# Patient Record
Sex: Male | Born: 1980 | Race: White | Hispanic: No | Marital: Single | Smoking: Current every day smoker
Health system: Southern US, Community
[De-identification: ages and names within clinical notes are randomized; demographics above are authoritative.]

---

## 2015-03-20 ENCOUNTER — Emergency Department (HOSPITAL_COMMUNITY)
Admission: EM | Admit: 2015-03-20 | Discharge: 2015-03-20 | Disposition: A | Discharge status: Home / Self Care | Payer: BLUE CROSS/BLUE SHIELD | Attending: Emergency Medicine | Admitting: Emergency Medicine

## 2015-03-20 ENCOUNTER — Emergency Department (HOSPITAL_COMMUNITY): Payer: BLUE CROSS/BLUE SHIELD

## 2015-03-20 ENCOUNTER — Encounter (HOSPITAL_COMMUNITY): Payer: Self-pay | Admitting: Emergency Medicine

## 2015-03-20 DIAGNOSIS — K529 Noninfective gastroenteritis and colitis, unspecified: Secondary | ICD-10-CM | POA: Insufficient documentation

## 2015-03-20 DIAGNOSIS — Z720 Tobacco use: Secondary | ICD-10-CM | POA: Insufficient documentation

## 2015-03-20 DIAGNOSIS — R1032 Left lower quadrant pain: Secondary | ICD-10-CM | POA: Diagnosis present

## 2015-03-20 LAB — I-STAT CHEM 8, ED
BUN: 4 mg/dL — ABNORMAL LOW (ref 6–20)
CALCIUM ION: 1.1 mmol/L — AB (ref 1.12–1.23)
Chloride: 99 mmol/L — ABNORMAL LOW (ref 101–111)
Creatinine, Ser: 0.9 mg/dL (ref 0.61–1.24)
GLUCOSE: 103 mg/dL — AB (ref 65–99)
HEMATOCRIT: 49 % (ref 39.0–52.0)
Hemoglobin: 16.7 g/dL (ref 13.0–17.0)
POTASSIUM: 3.7 mmol/L (ref 3.5–5.1)
SODIUM: 141 mmol/L (ref 135–145)
TCO2: 28 mmol/L (ref 0–100)

## 2015-03-20 LAB — COMPREHENSIVE METABOLIC PANEL
ALT: 62 U/L (ref 17–63)
AST: 38 U/L (ref 15–41)
Albumin: 4 g/dL (ref 3.5–5.0)
Alkaline Phosphatase: 62 U/L (ref 38–126)
Anion gap: 11 (ref 5–15)
BILIRUBIN TOTAL: 0.7 mg/dL (ref 0.3–1.2)
CO2: 27 mmol/L (ref 22–32)
Calcium: 8.9 mg/dL (ref 8.9–10.3)
Chloride: 101 mmol/L (ref 101–111)
Creatinine, Ser: 1.01 mg/dL (ref 0.61–1.24)
GFR calc Af Amer: >60 mL/min (ref >60)
GFR calc non Af Amer: >60 mL/min (ref >60)
GLUCOSE: 107 mg/dL — AB (ref 65–99)
Potassium: 3.7 mmol/L (ref 3.5–5.1)
Sodium: 139 mmol/L (ref 135–145)
Total Protein: 7.4 g/dL (ref 6.5–8.1)

## 2015-03-20 LAB — CBC WITH DIFFERENTIAL/PLATELET
Basophils Absolute: 0 10*3/uL (ref 0.0–0.1)
Basophils Relative: 0 % (ref 0–1)
Eosinophils Absolute: 0.2 10*3/uL (ref 0.0–0.7)
Eosinophils Relative: 3 % (ref 0–5)
HCT: 46.2 % (ref 39.0–52.0)
HEMOGLOBIN: 16 g/dL (ref 13.0–17.0)
LYMPHS ABS: 2.1 10*3/uL (ref 0.7–4.0)
LYMPHS PCT: 23 % (ref 12–46)
MCH: 29.3 pg (ref 26.0–34.0)
MCHC: 34.6 g/dL (ref 30.0–36.0)
MCV: 84.5 fL (ref 78.0–100.0)
Monocytes Absolute: 0.8 10*3/uL (ref 0.1–1.0)
Monocytes Relative: 9 % (ref 3–12)
NEUTROS PCT: 65 % (ref 43–77)
Neutro Abs: 6 10*3/uL (ref 1.7–7.7)
PLATELETS: 186 10*3/uL (ref 150–400)
RBC: 5.47 MIL/uL (ref 4.22–5.81)
RDW: 13 % (ref 11.5–15.5)
WBC: 9.2 10*3/uL (ref 4.0–10.5)

## 2015-03-20 LAB — I-STAT CG4 LACTIC ACID, ED: Lactic Acid, Venous: 0.94 mmol/L (ref 0.5–2.0)

## 2015-03-20 LAB — C DIFFICILE QUICK SCREEN W PCR REFLEX
C DIFFICILE (CDIFF) INTERP: NEGATIVE
C DIFFICLE (CDIFF) ANTIGEN: NEGATIVE
C Diff toxin: NEGATIVE

## 2015-03-20 LAB — LIPASE, BLOOD: Lipase: 17 U/L — ABNORMAL LOW (ref 22–51)

## 2015-03-20 MED ORDER — METRONIDAZOLE 500 MG PO TABS
500.0000 mg | ORAL_TABLET | Freq: Once | ORAL | Status: AC
  Administered 2015-03-20: 500 mg via ORAL
  Filled 2015-03-20: qty 1

## 2015-03-20 MED ORDER — IOHEXOL 300 MG/ML  SOLN
100.0000 mL | Freq: Once | INTRAMUSCULAR | Status: AC | PRN
  Administered 2015-03-20: 100 mL via INTRAVENOUS

## 2015-03-20 MED ORDER — METRONIDAZOLE 500 MG PO TABS: 500.0000 mg | ORAL_TABLET | Freq: Two times a day (BID) | ORAL | Status: AC

## 2015-03-20 MED ORDER — ONDANSETRON HCL 4 MG/2ML IJ SOLN
4.0000 mg | Freq: Once | INTRAMUSCULAR | Status: AC | PRN
  Administered 2015-03-20: 4 mg via INTRAVENOUS
  Filled 2015-03-20: qty 2

## 2015-03-20 MED ORDER — ONDANSETRON HCL 4 MG PO TABS: 4.0000 mg | ORAL_TABLET | Freq: Three times a day (TID) | ORAL | Status: AC | PRN

## 2015-03-20 MED ORDER — CIPROFLOXACIN HCL 500 MG PO TABS: 500.0000 mg | ORAL_TABLET | Freq: Two times a day (BID) | ORAL | Status: AC

## 2015-03-20 MED ORDER — SODIUM CHLORIDE 0.9 % IV BOLUS (SEPSIS)
1000.0000 mL | Freq: Once | INTRAVENOUS | Status: AC
  Administered 2015-03-20: 1000 mL via INTRAVENOUS

## 2015-03-20 MED ORDER — HYDROCODONE-ACETAMINOPHEN 5-325 MG PO TABS: ORAL_TABLET | ORAL | Status: AC

## 2015-03-20 MED ORDER — DICYCLOMINE HCL 20 MG PO TABS: 20.0000 mg | ORAL_TABLET | Freq: Two times a day (BID) | ORAL | Status: AC

## 2015-03-20 MED ORDER — DICYCLOMINE HCL 10 MG PO CAPS
10.0000 mg | ORAL_CAPSULE | Freq: Once | ORAL | Status: AC
  Administered 2015-03-20: 10 mg via ORAL
  Filled 2015-03-20: qty 1

## 2015-03-20 MED ORDER — CIPROFLOXACIN HCL 500 MG PO TABS
500.0000 mg | ORAL_TABLET | Freq: Once | ORAL | Status: AC
  Administered 2015-03-20: 500 mg via ORAL
  Filled 2015-03-20: qty 1

## 2015-03-20 NOTE — ED Notes (Signed)
Pt tolerating ginger ale 

## 2015-03-20 NOTE — ED Provider Notes (Signed)
CSN: [id number]     Arrival date & time 03/20/15  1453 History   First MD Initiated Contact with Patient 03/20/15 1647     Chief Complaint  Patient presents with  . Abdominal Pain     (Consider location/radiation/quality/duration/timing/severity/associated sxs/prior Treatment) HPI   Blood pressure 132/83, pulse 99, temperature 99.2 F (37.3 C), temperature source Oral, resp. rate 18, SpO2 99 %.  Blake Gilbert is a 34 y.o. male complaining of colicky left lower quadrant pain, 0 out of 10 right now, 8 out of 10 at worst onset 3 days ago associated with fever (Tmax 101 x2 days ago), watery, darker than normal stools with no gross blood. Patient denies sick contacts, recent travel, similar prior episodes, chest pain, shortness of breath, decreased urination, lightheadedness, palpitations, chest pain, shortness of breath.  History reviewed. No pertinent past medical history. History reviewed. No pertinent past surgical history. History reviewed. No pertinent family history. History  Substance Use Topics  . Smoking status: Current Every Day Smoker  . Smokeless tobacco: Not on file  . Alcohol Use: Yes     Comment: occ    Review of Systems  10 systems reviewed and found to be negative, except as noted in the HPI.  Allergies  Review of patient's allergies indicates no known allergies.  Home Medications   Prior to Admission medications   Medication Sig Start Date End Date Taking? Authorizing Provider  acetaminophen (TYLENOL) 325 MG tablet Take 650 mg by mouth every 6 (six) hours as needed for mild pain.   Yes Historical Provider, MD  ciprofloxacin (CIPRO) 500 MG tablet Take 1 tablet (500 mg total) by mouth every 12 (twelve) hours. 03/20/15   Elica Almas, PA-C  dicyclomine (BENTYL) 20 MG tablet Take 1 tablet (20 mg total) by mouth 2 (two) times daily. 03/20/15   Tayton Decaire, PA-C  HYDROcodone-acetaminophen (NORCO/VICODIN) 5-325 MG per tablet Take 1-2 tablets by mouth every 6 hours  as needed for pain and/or cough. 03/20/15   Ramone Gander, PA-C  Loperamide HCl (IMODIUM PO) Take 2 capsules by mouth daily as needed (stomach pain).   Yes Historical Provider, MD  metroNIDAZOLE (FLAGYL) 500 MG tablet Take 1 tablet (500 mg total) by mouth 2 (two) times daily. One tab PO bid x 10 days 03/20/15   Joni Reining Everlyn Farabaugh, PA-C  ondansetron (ZOFRAN) 4 MG tablet Take 1 tablet (4 mg total) by mouth every 8 (eight) hours as needed for nausea or vomiting. 03/20/15   Joni Reining Kenyatte Chatmon, PA-C   BP 127/81 mmHg  Pulse 97  Temp(Src) 99.2 F (37.3 C) (Oral)  Resp 18  SpO2 100% Physical Exam  Constitutional: He is oriented to person, place, and time. He appears well-developed and well-nourished. No distress.  HENT:  Head: Normocephalic and atraumatic.  Mouth/Throat: Oropharynx is clear and moist.  Eyes: Conjunctivae and EOM are normal.  Cardiovascular: Normal rate, regular rhythm and intact distal pulses.   Pulmonary/Chest: Effort normal and breath sounds normal. No stridor. No respiratory distress. He has no wheezes. He has no rales. He exhibits no tenderness.  Abdominal: Soft. There is tenderness.  Hyperactive bowel sounds, mildly tender to palpation in the left lower quadrant with no guarding or rebound  Stool is bright green (patient states that he's been drinking a lot of his favorite green Gatorade)  Musculoskeletal: Normal range of motion.  Neurological: He is alert and oriented to person, place, and time.  Psychiatric: He has a normal mood and affect.  Nursing note and vitals reviewed.  ED Course  Procedures (including critical care time) Labs Review Labs Reviewed  COMPREHENSIVE METABOLIC PANEL - Abnormal; Notable for the following:    Glucose, Bld 107 (*)    BUN <5 (*)    All other components within normal limits  LIPASE, BLOOD - Abnormal; Notable for the following:    Lipase 17 (*)    All other components within normal limits  I-STAT CHEM 8, ED - Abnormal; Notable for the  following:    Chloride 99 (*)    BUN 4 (*)    Glucose, Bld 103 (*)    Calcium, Ion 1.10 (*)    All other components within normal limits  C DIFFICILE QUICK SCAN W PCR REFLEX  CBC WITH DIFFERENTIAL/PLATELET  GI PATHOGEN PANEL BY PCR, STOOL  I-STAT CG4 LACTIC ACID, ED    Imaging Review Ct Abdomen Pelvis W Contrast  03/20/2015   CLINICAL DATA:  LLQ abd pain since Wednesday. Pt had episode of n/v Wednesday night. Pt has had diarrhea since Wednesday.  EXAM: CT ABDOMEN AND PELVIS WITH CONTRAST  TECHNIQUE: Multidetector CT imaging of the abdomen and pelvis was performed using the standard protocol following bolus administration of intravenous contrast.  CONTRAST:  OMNIPAQUE IOHEXOL 300 MG/ML  SOLN  COMPARISON:  None.  FINDINGS: Lung bases: Clear.  Heart normal in size.  Small hiatal hernia.  Liver, spleen, gallbladder, pancreas, adrenal glands:  Normal.  Kidneys, ureters, bladder:  Normal.  Lymph nodes: Slightly enlarged node just below the esophageal hiatus measuring 11 mm short axis. Several mildly prominent, but not enlarged, mesenteric lymph nodes, largest lying near the cecum measuring 9 mm in short axis.  Ascites:  None.  Gastrointestinal: Colon is mostly decompressed. There is mild hazy opacity in the mesentery adjacent to the ascending colon and cecum. There are areas of apparent mild wall thickening along the left transverse and descending colon. Mild colitis is suspected. No evidence diverticulitis. Questionable wall thickening of the terminal ileum without associated adjacent mesenteric inflammatory change. Remainder of the small bowel is unremarkable.  Musculoskeletal:  No significant abnormality.  IMPRESSION: 1. Probable mild colitis described above. This may be infectious or inflammatory. The terminal ileum appears thick walled without adjacent mesenteric inflammation. Mild ileitis is also suspected. There are several mesenteric lymph nodes that are likely reactive. 2. No other acute  finding. 3. Small hiatal hernia.   Electronically Signed   By: Amie Portland M.D.   On: 03/20/2015 18:24     EKG Interpretation None      MDM   Final diagnoses:  Enteritis    Filed Vitals:   03/20/15 1512 03/20/15 1830 03/20/15 1845  BP: 133/92 132/83 127/81  Pulse: 110 99 97  Temp: 99.2 F (37.3 C)    TempSrc: Oral    Resp: 20 18 18   SpO2: 99% 99% 100%    Medications  sodium chloride 0.9 % bolus 1,000 mL (1,000 mLs Intravenous New Bag/Given 03/20/15 1738)  iohexol (OMNIPAQUE) 300 MG/ML solution 100 mL (100 mLs Intravenous Contrast Given 03/20/15 1747)  ondansetron (ZOFRAN) injection 4 mg (4 mg Intravenous Given 03/20/15 1804)  ciprofloxacin (CIPRO) tablet 500 mg (500 mg Oral Given 03/20/15 1845)  metroNIDAZOLE (FLAGYL) tablet 500 mg (500 mg Oral Given 03/20/15 1845)  dicyclomine (BENTYL) capsule 10 mg (10 mg Oral Given 03/20/15 1845)    Blake Gilbert is a pleasant 34 y.o. male presenting with  multiple episodes of nausea, vomiting, diarrhea, fever and left lower quadrant abdominal pain onset 3 days ago.  Patient is mildly tachycardic initially. Abdominal exam is benign with mild tenderness palpation the left lower quadrant with no peritoneal signs. Blood work reassuring with no significant electrolyte abnormality, acute kidney injury, leukocytosis. MSE initiated CT which reveals with enteritis. Patient will be started on Cipro, Flagyl, Bentyl, Vicodin. He had an episode of emesis while in CT but is tolerating by mouth. Abdominal exam remains benign. Patient will be given primary care referral and referral to gastroenterology. I've advised him that if diarrhea continues after completion of his antibiotics course he can see GI at that time. We've had extensive discussion of return precautions patient verbalizes understanding. GI pathogen by PCR pending. Patient understands that this test will not result today and his PCP will need to follow it up.  Tachycardia has resolved with bolus, repeat  abdominal exam remains benign.  Evaluation does not show pathology that would require ongoing emergent intervention or inpatient treatment. Pt is hemodynamically stable and mentating appropriately. Discussed findings and plan with patient/guardian, who agrees with care plan. All questions answered. Return precautions discussed and outpatient follow up given.   New Prescriptions   CIPROFLOXACIN (CIPRO) 500 MG TABLET    Take 1 tablet (500 mg total) by mouth every 12 (twelve) hours.   DICYCLOMINE (BENTYL) 20 MG TABLET    Take 1 tablet (20 mg total) by mouth 2 (two) times daily.   HYDROCODONE-ACETAMINOPHEN (NORCO/VICODIN) 5-325 MG PER TABLET    Take 1-2 tablets by mouth every 6 hours as needed for pain and/or cough.   METRONIDAZOLE (FLAGYL) 500 MG TABLET    Take 1 tablet (500 mg total) by mouth 2 (two) times daily. One tab PO bid x 10 days   ONDANSETRON (ZOFRAN) 4 MG TABLET    Take 1 tablet (4 mg total) by mouth every 8 (eight) hours as needed for nausea or vomiting.         Wynetta Emery, PA-C 03/20/15 2015  Laurence Spates, MD 03/21/15 [phone]

## 2015-03-20 NOTE — ED Provider Notes (Signed)
MSE was initiated and I personally evaluated the patient and placed orders (if any) at  3:16 PM on March 20, 2015.  The patient appears stable so that the remainder of the MSE may be completed by another provider.  Subjective: 3 days abd pain fever, n/v, voluminous watery stools. Diaphoresis. tmax 101.  no foreing travel. No unsusual foods. Constant diarrhea and llq abdominal pain.  Appetite returning but continuing with diaRRHEA.( Voluminous, foul smelling) TOOK IMMODIUM AND TYLENOL WITHOUT RELIEF. RECENT ABX DOXYXYCLINE ended last dose 4 days ago. Severe pain with coughing, movement, laughing. Sent from urgent care. Patient declinse meds for pain/ nausea at this tiem   Objective- Well appearing male. NAD BP 133/92 mmHg  Pulse 110  Temp(Src) 99.2 F (37.3 C) (Oral)  Resp 20  SpO2 99% Chest: CATB, RRR Abd. BS hyperactive, exquisitely ttp LLQ.   Plan: Concern for Diverticulitis IBD- Ct abd pelvis with. C-diff screen  Labs pending        Arthor Captain, PA-C 03/20/15 1528  Laurence Spates, MD 03/23/15 [phone]

## 2015-03-20 NOTE — Discharge Instructions (Signed)
Push fluids: take small frequent sips of water or Gatorade, do not drink any soda, juice or caffeinated beverages.    Slowly resume solid diet as desired. Avoid food that are spicy, contain dairy and/or have high fat content.  Aviod NSAIDs (aspirin, motrin, ibuprofen, naproxen, Aleve et Karie Soda) for pain control because they will irritate your stomach.  Take vicodin for breakthrough pain, do not drink alcohol, drive, care for children or do other critical tasks while taking vicodin.  Please follow with your primary care doctor in the next 2 days for a check-up. They must obtain records for further management.   Do not hesitate to return to the Emergency Department for any new, worsening or concerning symptoms.   The stool samples were taken today will be tested for infection, these results will return for several days, at your primary care doctor we'll have to request records to evaluate this. Colitis Colitis is inflammation of the colon. Colitis can be a short-term or long-standing (chronic) illness. Crohn's disease and ulcerative colitis are 2 types of colitis which are chronic. They usually require lifelong treatment. CAUSES  There are many different causes of colitis, including:  Viruses.  Germs (bacteria).  Medicine reactions. SYMPTOMS   Diarrhea.  Intestinal bleeding.  Pain.  Fever.  Throwing up (vomiting).  Tiredness (fatigue).  Weight loss.  Bowel blockage. DIAGNOSIS  The diagnosis of colitis is based on examination and stool or blood tests. X-rays, CT scan, and colonoscopy may also be needed. TREATMENT  Treatment may include:  Fluids given through the vein (intravenously).  Bowel rest (nothing to eat or drink for a period of time).  Medicine for pain and diarrhea.  Medicines (antibiotics) that kill germs.  Cortisone medicines.  Surgery. HOME CARE INSTRUCTIONS   Get plenty of rest.  Drink enough water and fluids to keep your urine clear or pale  yellow.  Eat a well-balanced diet.  Call your caregiver for follow-up as recommended. SEEK IMMEDIATE MEDICAL CARE IF:   You develop chills.  You have an oral temperature above 102 F (38.9 C), not controlled by medicine.  You have extreme weakness, fainting, or dehydration.  You have repeated vomiting.  You develop severe belly (abdominal) pain or are passing bloody or tarry stools. MAKE SURE YOU:   Understand these instructions.  Will watch your condition.  Will get help right away if you are not doing well or get worse. Document Released: 09/08/2004 Document Revised: 10/24/2011 Document Reviewed: 12/04/2009 [facility] Patient Information 2015 [place], [place]. This information is not intended to replace advice given to you by your health care provider. Make sure you discuss any questions you have with your health care provider.

## 2015-03-20 NOTE — ED Notes (Signed)
Pt sent from [facility] for eval LLQ pain x 3 days with diarrhea

## 2015-03-24 ENCOUNTER — Ambulatory Visit (INDEPENDENT_AMBULATORY_CARE_PROVIDER_SITE_OTHER): Payer: BLUE CROSS/BLUE SHIELD | Admitting: Family Medicine

## 2015-03-24 VITALS — BP 116/74 | HR 92 | Temp 98.6°F | Resp 14 | Ht 75.0 in | Wt 265.0 lb

## 2015-03-24 DIAGNOSIS — K529 Noninfective gastroenteritis and colitis, unspecified: Secondary | ICD-10-CM

## 2015-03-24 LAB — POCT CBC
Granulocyte percent: 59.3 %G (ref 37–80)
HCT, POC: 51.2 % (ref 43.5–53.7)
Hemoglobin: 15.6 g/dL (ref 14.1–18.1)
Lymph, poc: 2.8 (ref 0.6–3.4)
MCH, POC: 25.8 pg — AB (ref 27–31.2)
MCHC: 30.6 g/dL — AB (ref 31.8–35.4)
MCV: 84.3 fL (ref 80–97)
MID (cbc): 0.5 (ref 0–0.9)
MPV: 7.1 fL (ref 0–99.8)
POC Granulocyte: 4.8 (ref 2–6.9)
POC LYMPH PERCENT: 34.8 % (ref 10–50)
POC MID %: 5.9 % (ref 0–12)
Platelet Count, POC: 298 10*3/uL (ref 142–424)
RBC: 6.08 M/uL (ref 4.69–6.13)
RDW, POC: 13.4 %
WBC: 8.1 10*3/uL (ref 4.6–10.2)

## 2015-03-24 LAB — COMPLETE METABOLIC PANEL WITH GFR
ALT: 59 U/L — ABNORMAL HIGH (ref 9–46)
AST: 33 U/L (ref 10–40)
Albumin: 4.3 g/dL (ref 3.6–5.1)
BUN: 9 mg/dL (ref 7–25)
CO2: 27 mmol/L (ref 20–31)
Creat: 1.05 mg/dL (ref 0.60–1.35)
GFR, Est African American: >89 mL/min (ref >=60)
GFR, Est Non African American: >89 mL/min (ref >=60)
Glucose, Bld: 80 mg/dL (ref 65–99)
Total Bilirubin: 0.5 mg/dL (ref 0.2–1.2)

## 2015-03-24 LAB — COMPLETE METABOLIC PANEL WITHOUT GFR
Alkaline Phosphatase: 63 U/L (ref 40–115)
Calcium: 9.2 mg/dL (ref 8.6–10.3)
Chloride: 103 mmol/L (ref 98–110)
Potassium: 4.3 mmol/L (ref 3.5–5.3)
Sodium: 141 mmol/L (ref 135–146)
Total Protein: 6.9 g/dL (ref 6.1–8.1)

## 2015-03-24 LAB — POCT SEDIMENTATION RATE: POCT SED RATE: 22 mm/h (ref 0–22)

## 2015-03-24 NOTE — Progress Notes (Signed)
    Chief Complaint:  Chief Complaint  Patient presents with  . Follow-up    Hospital f/u - LLQ pain and colitis    HPI: Blake Gilbert is a 33 y.o. male who reports to [facility] today complaining of follow up from ER for colitis. Currently on falgyl and cipro Last episode of diarrhea was last night. He is afebrile. 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 He has eaten a sandwish and that di not go very well but otherwise sticking to bland diet as much as possible. Still has slightly crampy pain in LLQ.  C diff was negative.    Please see hx from ER OV below.   Blake Gilbert is a pleasant 33 y.o. male presenting with multiple episodes of nausea, vomiting, diarrhea, fever and left lower quadrant abdominal pain onset 3 days ago. Patient is mildly tachycardic initially. Abdominal exam is benign with mild tenderness palpation the left lower quadrant with no peritoneal signs. Blood work reassuring with no significant electrolyte abnormality, acute kidney injury, leukocytosis. MSE initiated CT which reveals with enteritis. Patient will be started on Cipro, Flagyl, Bentyl, Vicodin. He had an episode of emesis while in CT but is tolerating by mouth. Abdominal exam remains benign. Patient will be given primary care referral and referral to gastroenterology. I've advised him that if diarrhea continues after completion of his antibiotics course he can see GI at that time. We've had extensive discussion of return precautions patient verbalizes understanding. GI pathogen by PCR pending. Patient understands that this test will not result today and his PCP will need to follow it up.  Tachycardia has resolved with bolus, repeat abdominal exam remains benign.  Evaluation does not show pathology that would require ongoing emergent intervention or inpatient treatment. Pt is  hemodynamically stable and mentating appropriately. Discussed findings and plan with patient/guardian, who agrees with care plan. All questions answered. Return precautions discussed and outpatient follow up given.   New Prescriptions   CIPROFLOXACIN (CIPRO) 500 MG TABLET  Take 1 tablet (500 mg total) by mouth every 12 (twelve) hours.   DICYCLOMINE (BENTYL) 20 MG TABLET  Take 1 tablet (20 mg total) by mouth 2 (two) times daily.   HYDROCODONE-ACETAMINOPHEN (NORCO/VICODIN) 5-325 MG PER TABLET  Take 1-2 tablets by mouth every 6 hours as needed for pain and/or cough.   METRONIDAZOLE (FLAGYL) 500 MG TABLET  Take 1 tablet (500 mg total) by mouth 2 (two) times daily. One tab PO bid x 10 days   ONDANSETRON (ZOFRAN) 4 MG TABLET  Take 1 tablet (4 mg total) by mouth every 8 (eight) hours as needed for nausea or vomiting.                  IMPRESSION: 1. Probable mild colitis described above. This may be infectious or inflammatory. The terminal ileum appears thick walled without adjacent mesenteric inflammation. Mild ileitis is also suspected. There are several mesenteric lymph nodes that are likely reactive. 2. No other acute finding. 3. Small hiatal hernia.   Electronically Signed  By: David Ormond M.D.  On: 03/20/2015 18:24   History reviewed. No pertinent past medical history. History reviewed. No pertinent past surgical history. History   Social History  . Marital Status: Single    Spouse Name: N/A  . Number of Children: N/A  . Years   of Education: N/A   Social History Main Topics  . Smoking status: Current Every Day Smoker  . Smokeless tobacco: Not on file  . Alcohol Use: Yes     Comment: occ  . Drug Use: No  . Sexual Activity: Not on file   Other Topics Concern  . None   Social History Narrative   History reviewed. No pertinent family history. No Known Allergies Prior to Admission medications   Medication Sig Start Date End Date  Taking? Authorizing Provider  acetaminophen (TYLENOL) 325 MG tablet Take 650 mg by mouth every 6 (six) hours as needed for mild pain.   Yes Historical Provider, MD  ciprofloxacin (CIPRO) 500 MG tablet Take 1 tablet (500 mg total) by mouth every 12 (twelve) hours. 03/20/15  Yes Nicole Pisciotta, PA-C  dicyclomine (BENTYL) 20 MG tablet Take 1 tablet (20 mg total) by mouth 2 (two) times daily. 03/20/15  Yes Nicole Pisciotta, PA-C  HYDROcodone-acetaminophen (NORCO/VICODIN) 5-325 MG per tablet Take 1-2 tablets by mouth every 6 hours as needed for pain and/or cough. 03/20/15  Yes Nicole Pisciotta, PA-C  metroNIDAZOLE (FLAGYL) 500 MG tablet Take 1 tablet (500 mg total) by mouth 2 (two) times daily. One tab PO bid x 10 days 03/20/15  Yes Nicole Pisciotta, PA-C  ondansetron (ZOFRAN) 4 MG tablet Take 1 tablet (4 mg total) by mouth every 8 (eight) hours as needed for nausea or vomiting. 03/20/15  Yes Nicole Pisciotta, PA-C     ROS: The patient denies fevers, chills, night sweats, unintentional weight loss, chest pain, palpitations, wheezing, dyspnea on exertion,  dysuria, hematuria, melena, numbness, weakness, or tingling.   All other systems have been reviewed and were otherwise negative with the exception of those mentioned in the HPI and as above.    PHYSICAL EXAM: Filed Vitals:   03/24/15 1108  BP: 116/74  Pulse: 92  Temp: 98.6 F (37 C)  Resp: 14   Body mass index is 33.12 kg/(m^2).   General: Alert, no acute distress HEENT:  Normocephalic, atraumatic, oropharynx patent. EOMI, PERRLA Cardiovascular:  Regular rate and rhythm, no rubs murmurs or gallops.  No Carotid bruits, radial pulse intact. No pedal edema.  Respiratory: Clear to auscultation bilaterally.  No wheezes, rales, or rhonchi.  No cyanosis, no use of accessory musculature Abdominal: No organomegaly, abdomen is soft and left lower quadrant minimally tender, positive bowel sounds. No masses. Skin: No rashes. Neurologic: Facial musculature  symmetric. Psychiatric: Patient acts appropriately throughout our interaction. Lymphatic: No cervical or submandibular lymphadenopathy Musculoskeletal: Gait intact. No edema, tenderness   LABS: Results for orders placed or performed in visit on 03/24/15  POCT CBC  Result Value Ref Range   WBC 8.1 4.6 - 10.2 K/uL   Lymph, poc 2.8 0.6 - 3.4   POC LYMPH PERCENT 34.8 10 - 50 %L   MID (cbc) 0.5 0 - 0.9   POC MID % 5.9 0 - 12 %M   POC Granulocyte 4.8 2 - 6.9   Granulocyte percent 59.3 37 - 80 %G   RBC 6.08 4.69 - 6.13 M/uL   Hemoglobin 15.6 14.1 - 18.1 g/dL   HCT, POC 51.2 43.5 - 53.7 %   MCV 84.3 80 - 97 fL   MCH, POC 25.8 (A) 27 - 31.2 pg   MCHC 30.6 (A) 31.8 - 35.4 g/dL   RDW, POC 13.4 %   Platelet Count, POC 298 142 - 424 K/uL   MPV 7.1 0 - 99.8 fL     EKG/XRAY:  Primary read interpreted by Dr. Marin Comment at [facility].   ASSESSMENT/PLAN: Encounter Diagnoses  Name Primary?  Marland Kitchen Noninfectious gastroenteritis, unspecified Yes  . Enteritis    ESR pending Advised to continue with BRAT diet  CBC is stable. We will see a sedimentation rate if he continues to not improve then will refer to GI. Advised him to take Zofran before his other medications to decrease nausea. Work note given, push fluids,  Gross sideeffects, risk and benefits, and alternatives of medications d/w patient. Patient is aware that all medications have potential sideeffects and we are unable to predict every sideeffect or drug-drug interaction that may occur.  Brittannie Tawney DO  03/24/2015 12:25 PM

## 2015-03-24 NOTE — Patient Instructions (Addendum)
Colitis Colitis is inflammation of the colon. Colitis can be a short-term or long-standing (chronic) illness. Crohn's disease and ulcerative colitis are 2 types of colitis which are chronic. They usually require lifelong treatment. CAUSES  There are many different causes of colitis, including:  Viruses.  Germs (bacteria).  Medicine reactions. SYMPTOMS   Diarrhea.  Intestinal bleeding.  Pain.  Fever.  Throwing up (vomiting).  Tiredness (fatigue).  Weight loss.  Bowel blockage. DIAGNOSIS  The diagnosis of colitis is based on examination and stool or blood tests. X-rays, CT scan, and colonoscopy may also be needed. TREATMENT  Treatment may include:  Fluids given through the vein (intravenously).  Bowel rest (nothing to eat or drink for a period of time).  Medicine for pain and diarrhea.  Medicines (antibiotics) that kill germs.  Cortisone medicines.  Surgery. HOME CARE INSTRUCTIONS   Get plenty of rest.  Drink enough water and fluids to keep your urine clear or pale yellow.  Eat a well-balanced diet.  Call your caregiver for follow-up as recommended. SEEK IMMEDIATE MEDICAL CARE IF:   You develop chills.  You have an oral temperature above 102 F (38.9 C), not controlled by medicine.  You have extreme weakness, fainting, or dehydration.  You have repeated vomiting.  You develop severe belly (abdominal) pain or are passing bloody or tarry stools. MAKE SURE YOU:   Understand these instructions.  Will watch your condition.  Will get help right away if you are not doing well or get worse. Document Released: 09/08/2004 Document Revised: 10/24/2011 Document Reviewed: 12/04/2009 [facility] Patient Information 2015 [place], [place]. This information is not intended to replace advice given to you by your health care provider. Make sure you discuss any questions you have with your health care provider. Food Choices to Help Relieve Diarrhea When you have  diarrhea, the foods you eat and your eating habits are very important. Choosing the right foods and drinks can help relieve diarrhea. Also, because diarrhea can last up to 7 days, you need to replace lost fluids and electrolytes (such as sodium, potassium, and chloride) in order to help prevent dehydration.  WHAT GENERAL GUIDELINES DO I NEED TO FOLLOW?  Slowly drink 1 cup (8 oz) of fluid for each episode of diarrhea. If you are getting enough fluid, your urine will be clear or pale yellow.  Eat starchy foods. Some good choices include white rice, white toast, pasta, low-fiber cereal, baked potatoes (without the skin), saltine crackers, and bagels.  Avoid large servings of any cooked vegetables.  Limit fruit to two servings per day. A serving is  cup or 1 small piece.  Choose foods with less than 2 g of fiber per serving.  Limit fats to less than 8 tsp (38 g) per day.  Avoid fried foods.  Eat foods that have probiotics in them. Probiotics can be found in certain dairy products.  Avoid foods and beverages that may increase the speed at which food moves through the stomach and intestines (gastrointestinal tract). Things to avoid include:  High-fiber foods, such as dried fruit, raw fruits and vegetables, nuts, seeds, and whole grain foods.  Spicy foods and high-fat foods.  Foods and beverages sweetened with high-fructose corn syrup, honey, or sugar alcohols such as xylitol, sorbitol, and mannitol. WHAT FOODS ARE RECOMMENDED? Grains White rice. White, [place], or pita breads (fresh or toasted), including plain rolls, buns, or bagels. White pasta. Saltine, soda, or graham crackers. Pretzels. Low-fiber cereal. Cooked cereals made with water (such as cornmeal, farina,  or cream cereals). Plain muffins. Matzo. Melba toast. Zwieback.  Vegetables Potatoes (without the skin). Strained tomato and vegetable juices. Most well-cooked and canned vegetables without seeds. Tender lettuce. Fruits Cooked  or canned applesauce, apricots, cherries, fruit cocktail, grapefruit, peaches, pears, or plums. Fresh bananas, apples without skin, cherries, grapes, cantaloupe, grapefruit, peaches, oranges, or plums.  Meat and Other Protein Products Baked or boiled chicken. Eggs. Tofu. Fish. Seafood. Smooth peanut butter. Ground or well-cooked tender beef, ham, veal, lamb, pork, or poultry.  Dairy Plain yogurt, kefir, and unsweetened liquid yogurt. Lactose-free milk, buttermilk, or soy milk. Plain hard cheese. Beverages Sport drinks. Clear broths. Diluted fruit juices (except prune). Regular, caffeine-free sodas such as ginger ale. Water. Decaffeinated teas. Oral rehydration solutions. Sugar-free beverages not sweetened with sugar alcohols. Other Bouillon, broth, or soups made from recommended foods.  The items listed above may not be a complete list of recommended foods or beverages. Contact your dietitian for more options. WHAT FOODS ARE NOT RECOMMENDED? Grains Whole grain, whole wheat, bran, or rye breads, rolls, pastas, crackers, and cereals. Wild or brown rice. Cereals that contain more than 2 g of fiber per serving. Corn tortillas or taco shells. Cooked or dry oatmeal. Granola. Popcorn. Vegetables Raw vegetables. Cabbage, broccoli, Brussels sprouts, artichokes, baked beans, beet greens, corn, kale, legumes, peas, sweet potatoes, and yams. Potato skins. Cooked spinach and cabbage. Fruits Dried fruit, including raisins and dates. Raw fruits. Stewed or dried prunes. Fresh apples with skin, apricots, mangoes, pears, raspberries, and strawberries.  Meat and Other Protein Products Chunky peanut butter. Nuts and seeds. Beans and lentils. Tomasa Blase.  Dairy High-fat cheeses. Milk, chocolate milk, and beverages made with milk, such as milk shakes. Cream. Ice cream. Sweets and Desserts Sweet rolls, doughnuts, and sweet breads. Pancakes and waffles. Fats and Oils Butter. Cream sauces. Margarine. Salad oils. Plain  salad dressings. Olives. Avocados.  Beverages Caffeinated beverages (such as coffee, tea, soda, or energy drinks). Alcoholic beverages. Fruit juices with pulp. Prune juice. Soft drinks sweetened with high-fructose corn syrup or sugar alcohols. Other Coconut. Hot sauce. Chili powder. Mayonnaise. Gravy. Cream-based or milk-based soups.  The items listed above may not be a complete list of foods and beverages to avoid. Contact your dietitian for more information. WHAT SHOULD I DO IF I BECOME DEHYDRATED? Diarrhea can sometimes lead to dehydration. Signs of dehydration include dark urine and dry mouth and skin. If you think you are dehydrated, you should rehydrate with an oral rehydration solution. These solutions can be purchased at pharmacies, retail stores, or online.  Drink -1 cup (120-240 mL) of oral rehydration solution each time you have an episode of diarrhea. If drinking this amount makes your diarrhea worse, try drinking smaller amounts more often. For example, drink 1-3 tsp (5-15 mL) every 5-10 minutes.  A general rule for staying hydrated is to drink 1-2 L of fluid per day. Talk to your health care provider about the specific amount you should be drinking each day. Drink enough fluids to keep your urine clear or pale yellow. Document Released: 10/22/2003 Document Revised: 08/06/2013 Document Reviewed: 06/24/2013 [facility] Patient Information 2015 [place], [place]. This information is not intended to replace advice given to you by your health care provider. Make sure you discuss any questions you have with your health care provider.

## 2015-03-25 LAB — GI PATHOGEN PANEL BY PCR, STOOL
C DIFFICILE TOXIN A/B: NOT DETECTED
Campylobacter by PCR: NOT DETECTED
Cryptosporidium by PCR: NOT DETECTED
E COLI (ETEC) LT/ST: NOT DETECTED
E COLI 0157 BY PCR: NOT DETECTED
E coli (STEC): NOT DETECTED
G lamblia by PCR: NOT DETECTED
Norovirus GI/GII: NOT DETECTED
Rotavirus A by PCR: NOT DETECTED
SHIGELLA BY PCR: NOT DETECTED

## 2015-04-17 ENCOUNTER — Encounter: Payer: Self-pay | Admitting: Family Medicine

## 2016-08-19 ENCOUNTER — Emergency Department
Admission: EM | Admit: 2016-08-19 | Discharge: 2016-08-19 | Disposition: A | Discharge status: Home / Self Care | Payer: Worker's Compensation | Attending: Emergency Medicine | Admitting: Emergency Medicine

## 2016-08-19 ENCOUNTER — Emergency Department: Payer: Worker's Compensation

## 2016-08-19 ENCOUNTER — Encounter: Payer: Self-pay | Admitting: Emergency Medicine

## 2016-08-19 DIAGNOSIS — F17200 Nicotine dependence, unspecified, uncomplicated: Secondary | ICD-10-CM | POA: Insufficient documentation

## 2016-08-19 DIAGNOSIS — M25461 Effusion, right knee: Secondary | ICD-10-CM | POA: Insufficient documentation

## 2016-08-19 DIAGNOSIS — M25561 Pain in right knee: Secondary | ICD-10-CM | POA: Diagnosis present

## 2016-08-19 MED ORDER — TRAMADOL HCL 50 MG PO TABS
50.0000 mg | ORAL_TABLET | Freq: Once | ORAL | Status: DC
  Filled 2016-08-19: qty 1

## 2016-08-19 MED ORDER — TRAMADOL HCL 50 MG PO TABS: 50.0000 mg | ORAL_TABLET | Freq: Four times a day (QID) | ORAL | 0 refills | Status: AC | PRN

## 2016-08-19 MED ORDER — MELOXICAM 7.5 MG PO TABS
15.0000 mg | ORAL_TABLET | Freq: Once | ORAL | Status: AC
  Administered 2016-08-19: 15 mg via ORAL
  Filled 2016-08-19: qty 2

## 2016-08-19 MED ORDER — MELOXICAM 15 MG PO TABS: 15.0000 mg | ORAL_TABLET | Freq: Every day | ORAL | 0 refills | Status: AC

## 2016-08-19 NOTE — ED Triage Notes (Signed)
Patient states that he was at work today, patient had sudden onset of right knee pain. Patient states that he works in [employer] and is in all different positions at work, does not recall doing anything to injure his knee.

## 2016-08-19 NOTE — ED Notes (Signed)
Pt states when he was at work today he had a sudden onset of knee pain. Pt came in on crutches. Pt states he has hx of knee injury to both knees.  Pt unable to bear weight and states pain is worse when he tried to put weight on the right.

## 2016-08-19 NOTE — ED Notes (Signed)
Pt WC profile states "Upon request Only" this tech attempts to call employer twice, message was not left due to HIPPA violations. WC drug screening not performed by this tech

## 2016-08-19 NOTE — ED Notes (Signed)
Pt states he is driving home and is not able to take the Tramadol or wear the knee immobilzer at this time. Crutches given as well. Pt able to demonstrate correct way to use crutches.

## 2016-08-19 NOTE — ED Provider Notes (Signed)
[facility] Emergency Department Provider Note ____________________________________________  Time seen: Approximately 5:39 PM  I have reviewed the triage vital signs and the nursing notes.   HISTORY  Chief Complaint Knee Pain    HPI Blake Gilbert is a 36 y.o. male who presents to the emergency department for evaluation of right knee pain. He states that he was at work today and had a sudden onset of right knee pain. He denies a specific injury. He denies previous pain similar to this. He has not taken any medications prior to arrival for his pain.  History reviewed. No pertinent past medical history.  There are no active problems to display for this patient.   History reviewed. No pertinent surgical history.  Prior to Admission medications   Medication Sig Start Date End Date Taking? Authorizing Provider  acetaminophen (TYLENOL) 325 MG tablet Take 650 mg by mouth every 6 (six) hours as needed for mild pain.    Historical Provider, MD  ciprofloxacin (CIPRO) 500 MG tablet Take 1 tablet (500 mg total) by mouth every 12 (twelve) hours. 03/20/15   Nicole Pisciotta, PA-C  dicyclomine (BENTYL) 20 MG tablet Take 1 tablet (20 mg total) by mouth 2 (two) times daily. 03/20/15   Nicole Pisciotta, PA-C  HYDROcodone-acetaminophen (NORCO/VICODIN) 5-325 MG per tablet Take 1-2 tablets by mouth every 6 hours as needed for pain and/or cough. 03/20/15   Nicole Pisciotta, PA-C  meloxicam (MOBIC) 15 MG tablet Take 1 tablet (15 mg total) by mouth daily. 08/19/16   Chinita Pester, FNP  metroNIDAZOLE (FLAGYL) 500 MG tablet Take 1 tablet (500 mg total) by mouth 2 (two) times daily. One tab PO bid x 10 days 03/20/15   Joni Reining Pisciotta, PA-C  ondansetron (ZOFRAN) 4 MG tablet Take 1 tablet (4 mg total) by mouth every 8 (eight) hours as needed for nausea or vomiting. 03/20/15   Joni Reining Pisciotta, PA-C  traMADol (ULTRAM) 50 MG tablet Take 1 tablet (50 mg total) by mouth every 6 (six) hours as needed.  08/19/16   Chinita Pester, FNP    Allergies Patient has no known allergies.  No family history on file.  Social History Social History  Substance Use Topics  . Smoking status: Current Every Day Smoker  . Smokeless tobacco: Never Used  . Alcohol use Yes     Comment: occ    Review of Systems Constitutional: No recent illness. Cardiovascular: Denies chest pain or palpitations. Respiratory: Denies shortness of breath. Musculoskeletal: Pain in Right knee Skin: Negative for rash, wound, lesion. Neurological: Negative for focal weakness or numbness.  ____________________________________________   PHYSICAL EXAM:  VITAL SIGNS: ED Triage Vitals  Enc Vitals Group     BP 08/19/16 1637 120/69     Pulse Rate 08/19/16 1637 83     Resp 08/19/16 1637 16     Temp 08/19/16 1637 98.5 F (36.9 C)     Temp Source 08/19/16 1637 Oral     SpO2 08/19/16 1637 99 %     Weight 08/19/16 1634 266 lb (120.7 kg)     Height 08/19/16 1634 6\' 2"  (1.88 m)     Head Circumference --      Peak Flow --      Pain Score 08/19/16 1735 0     Pain Loc --      Pain Edu? --      Excl. in GC? --     Constitutional: Alert and oriented. Well appearing and in no acute distress. Eyes: Conjunctivae  are normal. EOMI. Head: Atraumatic. Neck: No stridor.  Respiratory: Normal respiratory effort.   Musculoskeletal: Prepatellar tenderness on exam. He has full, active range of motion. Joint is stable on exam, but is painful with any focal knee exam technique. No obvious large joint effusion. Neurologic:  Normal speech and language. No gross focal neurologic deficits are appreciated. Speech is normal. No gait instability. Skin:  Skin is warm, dry and intact. Atraumatic. Psychiatric: Mood and affect are normal. Speech and behavior are normal.  ____________________________________________   LABS (all labs ordered are listed, but only abnormal results are displayed)  Labs Reviewed - No data to  display ____________________________________________  RADIOLOGY  Moderately large joint effusion of the right knee per radiology. ____________________________________________   PROCEDURES  Procedure(s) performed: Knee immobilizer applied by ER tech. She was neurovascularly intact post-application.   ____________________________________________   INITIAL IMPRESSION / ASSESSMENT AND PLAN / ED COURSE  Clinical Course     Pertinent labs & imaging results that were available during my care of the patient were reviewed by me and considered in my medical decision making (see chart for details).  Patient was instructed to wear the knee immobilizer while out of bed. He is instructed to rest, ice, and elevate the right lower extremity as often as possible for the next few days. He was instructed to call and schedule an appointment with orthopedics. He was instructed to return to the emergency department for symptoms that change or worsen if he is unable to schedule an appointment. ____________________________________________   FINAL CLINICAL IMPRESSION(S) / ED DIAGNOSES  Final diagnoses:  Effusion of right knee       Chinita PesterCari B Jasmeet Manton, FNP 08/19/16 2358    Jeanmarie PlantJames A McShane, MD 08/20/16 [phone]

## 2016-08-19 NOTE — Discharge Instructions (Signed)
Follow up with orthopedics. Wear the knee immobilizer while out of bed. Return to the emergency department for symptoms that change or worsen or for new concerns if you are unable to schedule an appointment.

## 2017-03-17 IMAGING — CT CT ABD-PELV W/ CM
2 of 5 series · 9 of 46 positions shown, 10 images · IV contrast (Iodine)
Comparison: None.

CLINICAL DATA: LLQ abd pain since [REDACTED]. Pt had episode of n/v
[REDACTED] night. Pt has had diarrhea since [REDACTED].

EXAM:
CT ABDOMEN AND PELVIS WITH CONTRAST
TECHNIQUE: Multidetector CT imaging of the abdomen and pelvis was performed
using the standard protocol following bolus administration of
intravenous contrast.
CONTRAST:  100mL OMNIPAQUE IOHEXOL 300 MG/ML  SOLN

[Series 301: routine, idose (2) · axial · 0.83mm/px · z∈[+32,+477]mm · 6 of 109 slices shown, 7 images]
[im 10/109  soft-tissue]
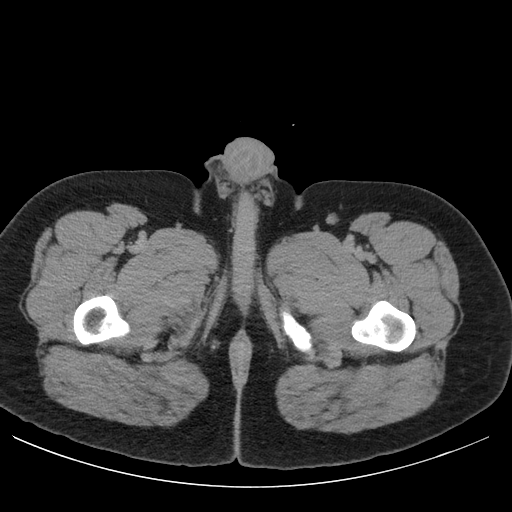
[im 10/109  bone]
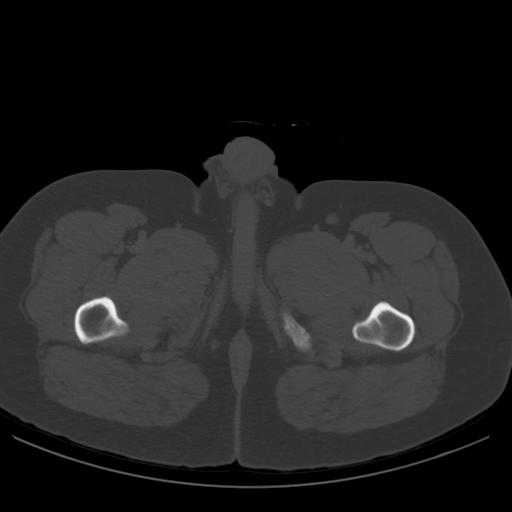
[im 30/109  soft-tissue]
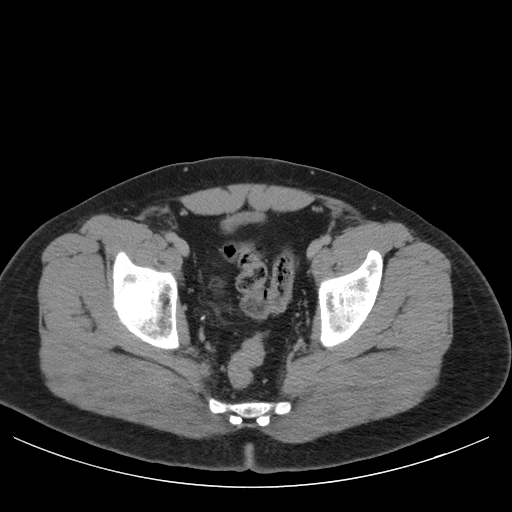
[im 45/109  soft-tissue]
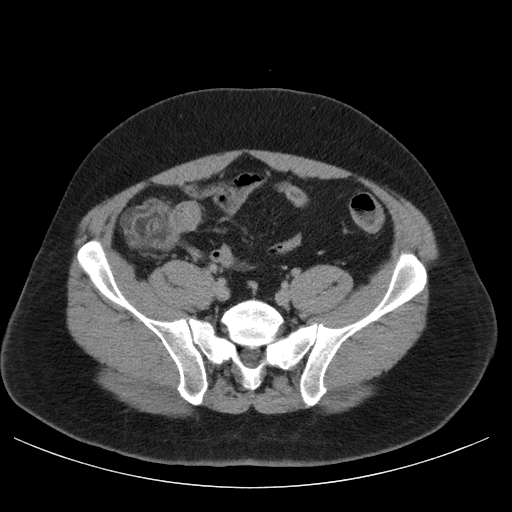
[im 64/109  soft-tissue]
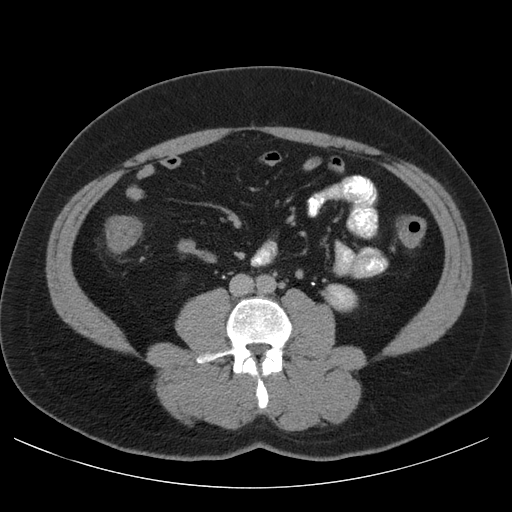
[im 79/109  soft-tissue]
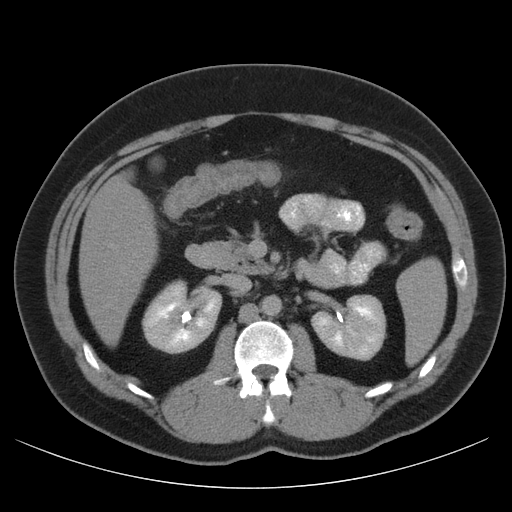
[im 99/109  soft-tissue]
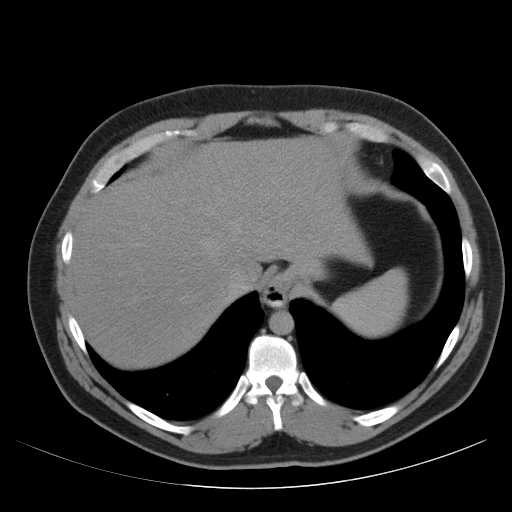

[Series 303: coronals, idose (2) · coronal · 0.45mm/px · 3 of 136 slices shown]
[im 46/136  soft-tissue]
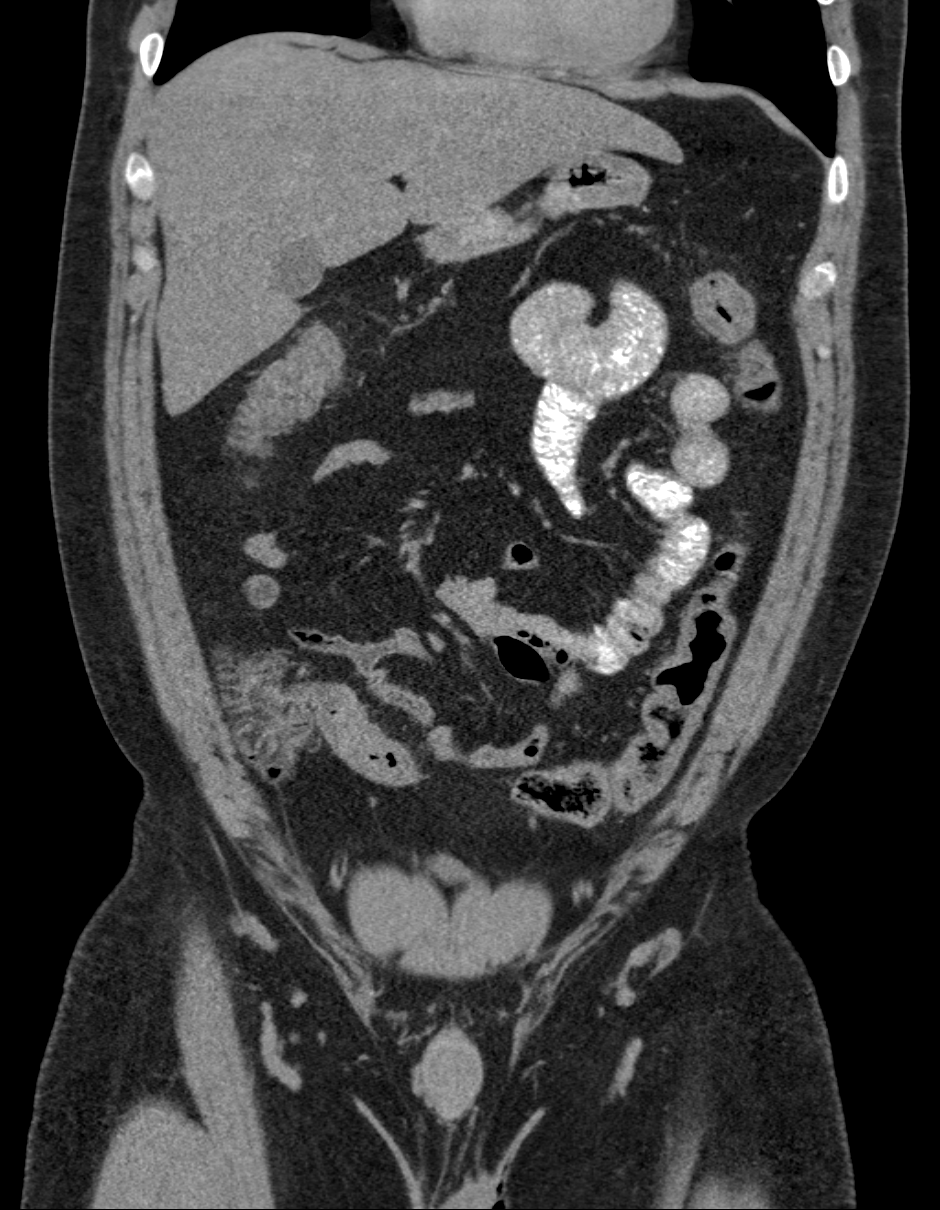
[im 61/136  soft-tissue]
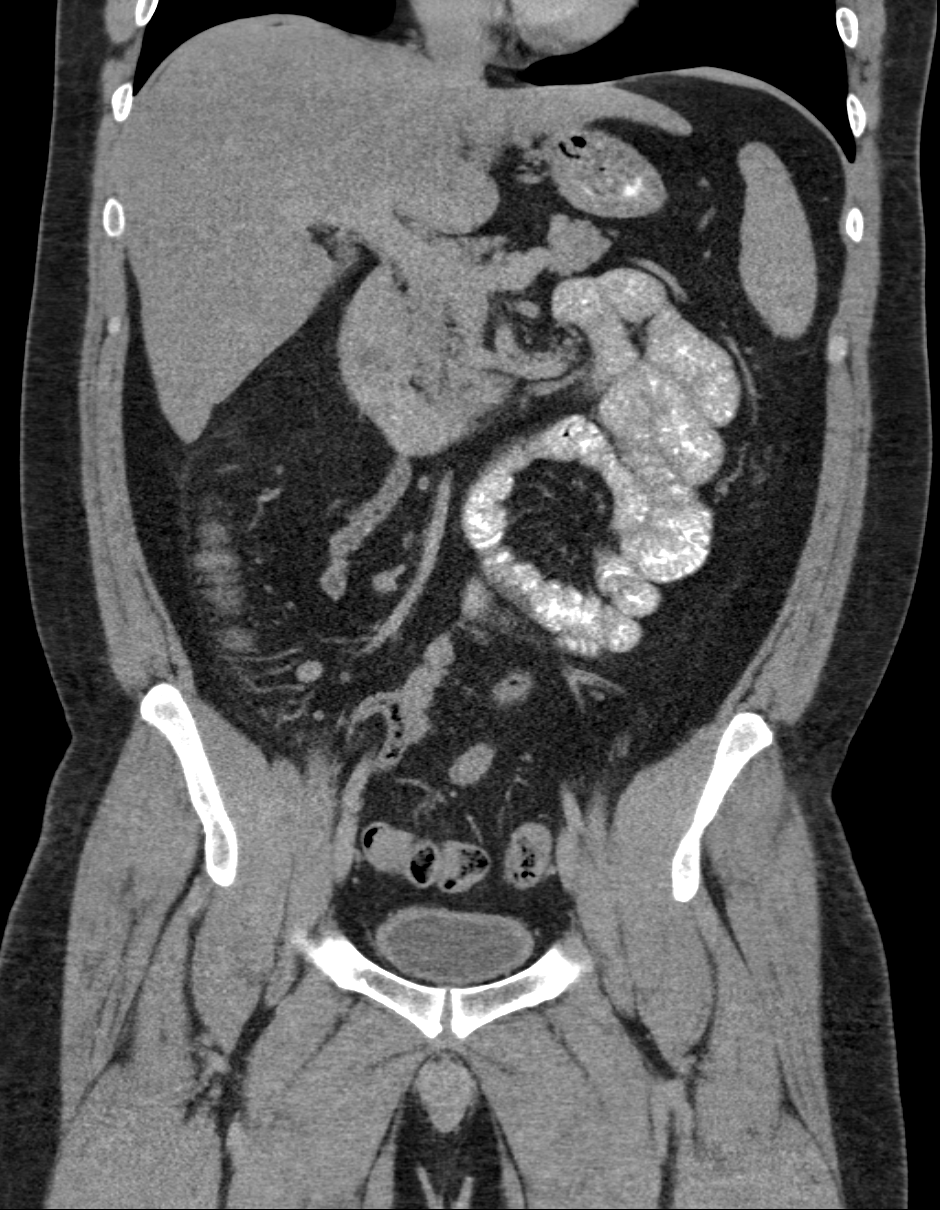
[im 76/136  soft-tissue]
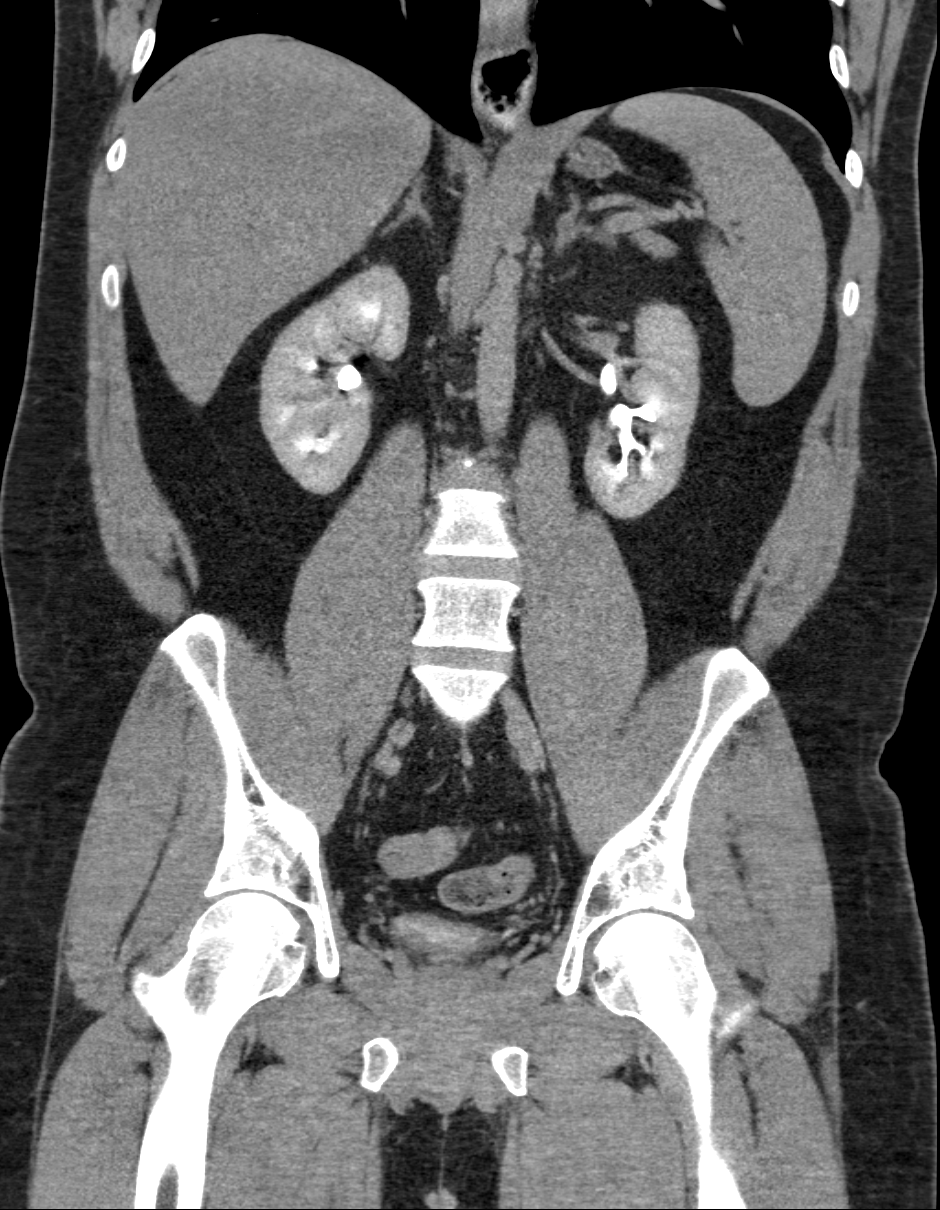

[9 of 46 positions shown; findings below may reference images not displayed]

FINDINGS: Lung bases: Clear.  Heart normal in size.  Small hiatal hernia.

Liver, spleen, gallbladder, pancreas, adrenal glands:  Normal.

Kidneys, ureters, bladder:  Normal.

Lymph nodes: Slightly enlarged node just below the esophageal hiatus
measuring 11 mm short axis. Several mildly prominent, but not
enlarged, mesenteric lymph nodes, largest lying near the cecum
measuring 9 mm in short axis.

Ascites:  None.

Gastrointestinal: Colon is mostly decompressed. There is mild hazy
opacity in the mesentery adjacent to the ascending colon and cecum.
There are areas of apparent mild wall thickening along the left
transverse and descending colon. Mild colitis is suspected. No
evidence diverticulitis. Questionable wall thickening of the
terminal ileum without associated adjacent mesenteric inflammatory
change. Remainder of the small bowel is unremarkable.

Musculoskeletal:  No significant abnormality.
IMPRESSION: 1. Probable mild colitis described above. This may be infectious or
inflammatory. The terminal ileum appears thick walled without
adjacent mesenteric inflammation. Mild ileitis is also suspected.
There are several mesenteric lymph nodes that are likely reactive.
2. No other acute finding.
3. Small hiatal hernia.

## 2018-08-17 IMAGING — CR DG KNEE COMPLETE 4+V*R*
1 series · 4 of 4 positions shown · non-contrast
Comparison: None.

CLINICAL DATA: Knee pain.  No injury

EXAM:
RIGHT KNEE - COMPLETE 4+ VIEW

[Series 1: dg knee complete 4 views right · 0.14mm/px · 4 of 4 slices shown]
[im 1/4]
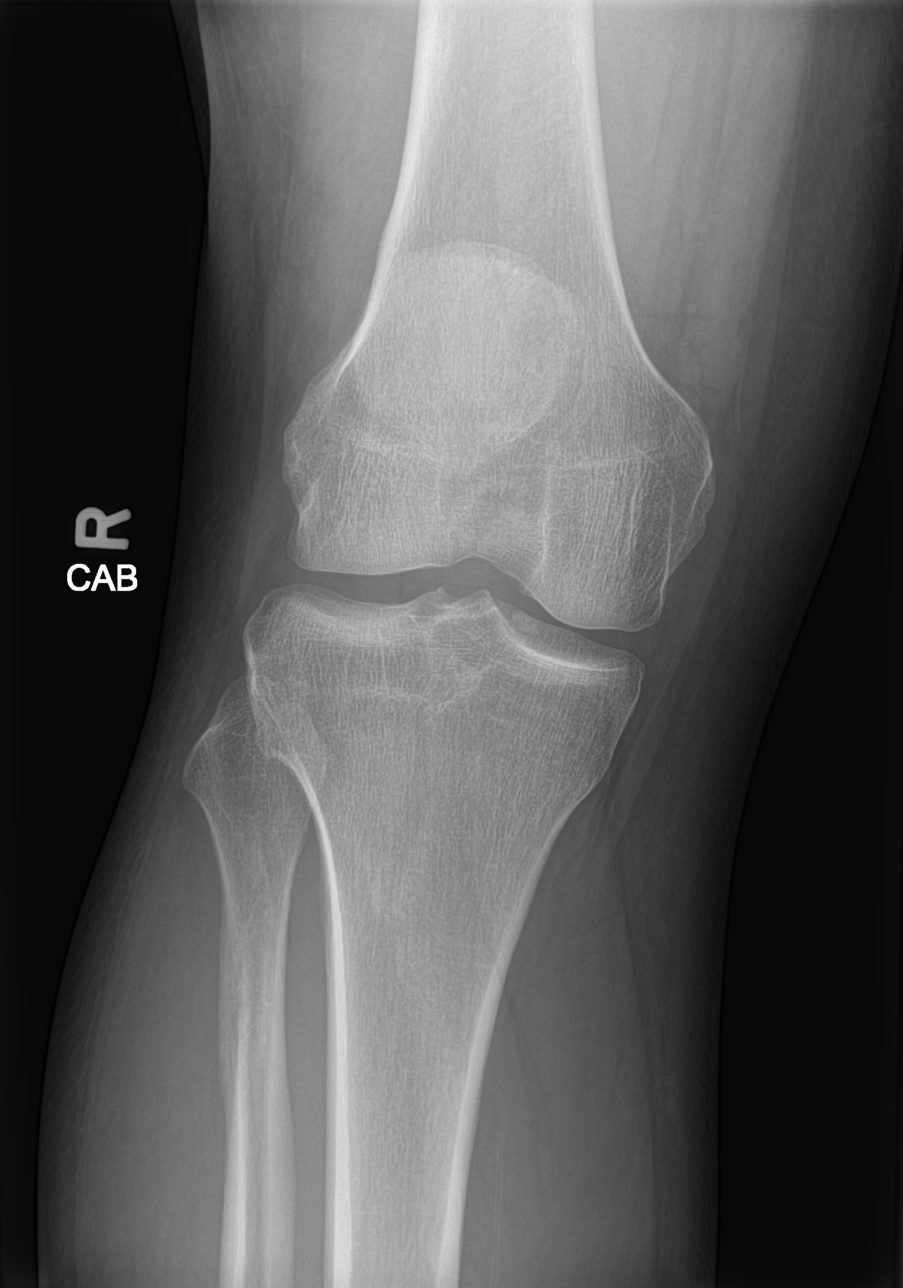
[im 2/4]
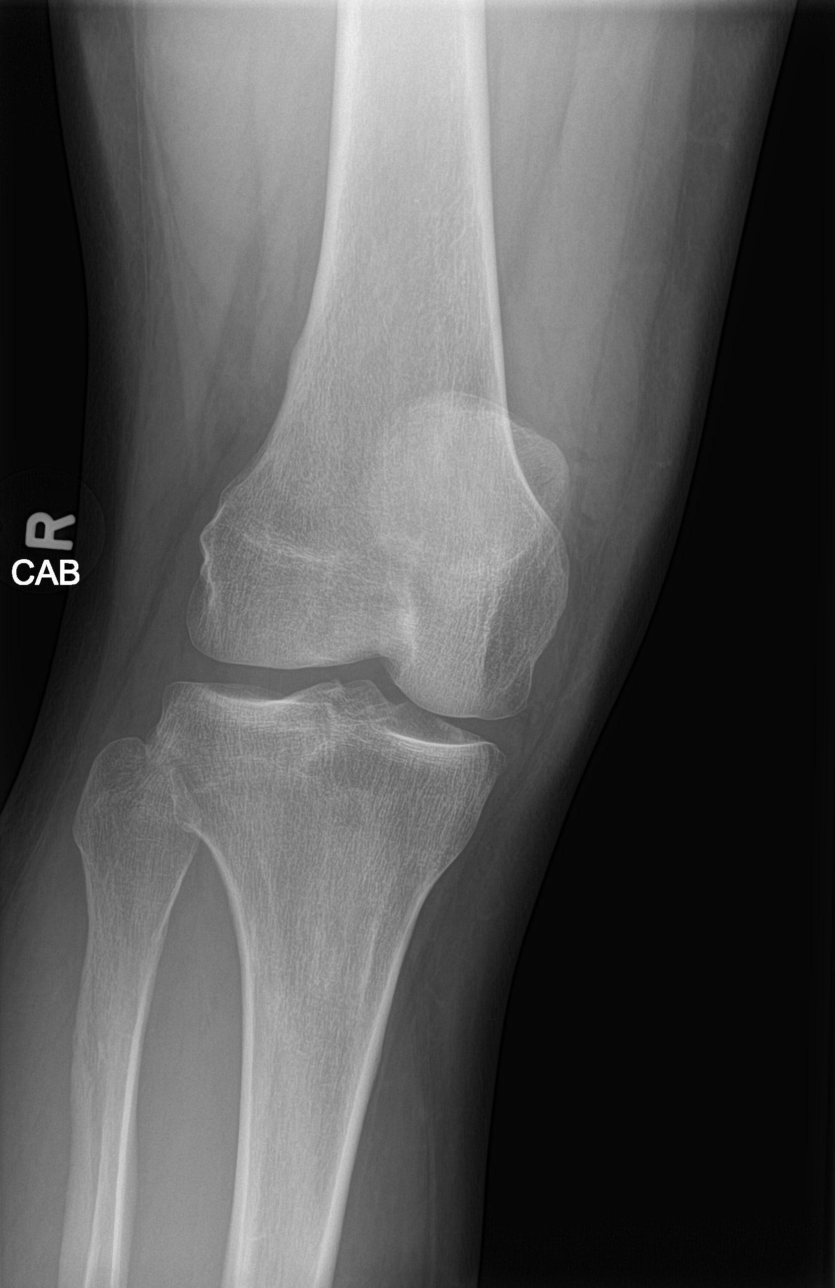
[im 3/4]
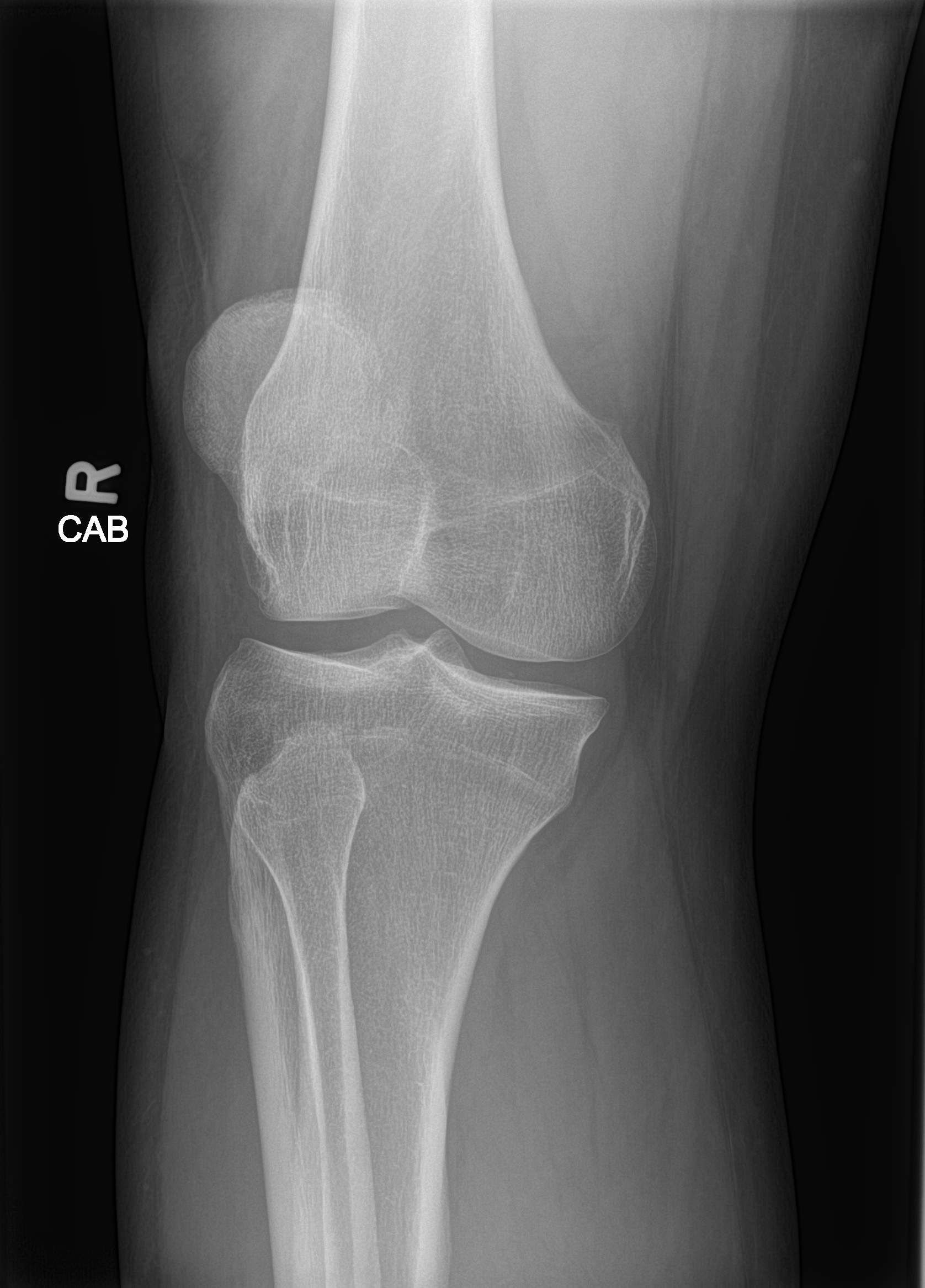
[im 4/4]
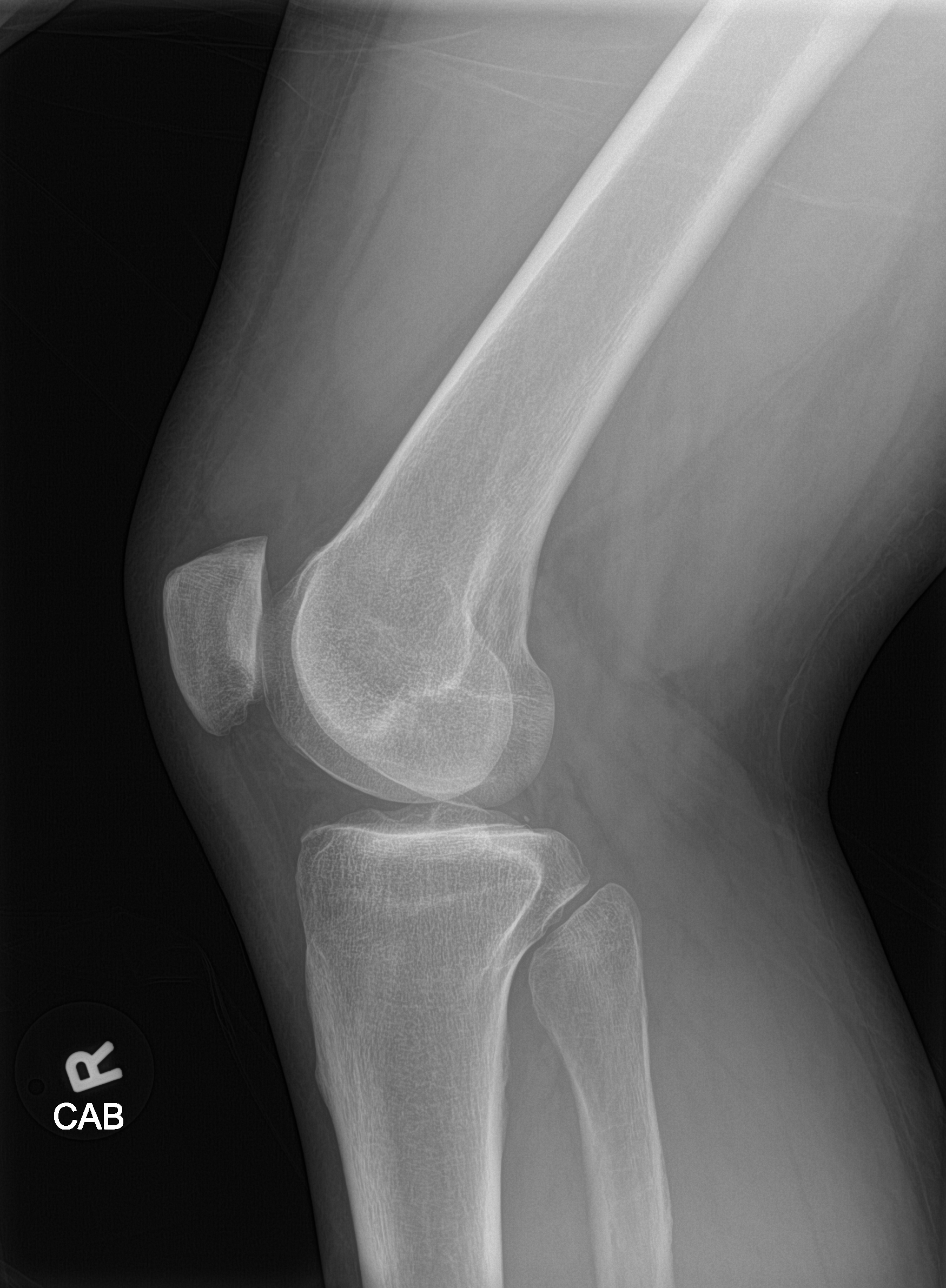

[4 of 4 positions shown; findings below may reference images not displayed]

FINDINGS: Normal alignment. No fracture. Joint space is normal. Moderately
large joint effusion.
IMPRESSION: Joint effusion.  Negative for arthropathy or fracture.
